# Patient Record
Sex: Female | Born: 2000 | Race: Black or African American | Hispanic: No | Marital: Single | Smoking: Never smoker
Health system: Southern US, Community
[De-identification: ages and names within clinical notes are randomized; demographics above are authoritative.]

## PROBLEM LIST (undated history)

## (undated) DIAGNOSIS — E611 Iron deficiency: Secondary | ICD-10-CM

## (undated) HISTORY — PX: CARDIAC SURGERY: SHX584

## (undated) HISTORY — PX: EYE SURGERY: SHX253

---

## 2018-10-01 ENCOUNTER — Emergency Department (HOSPITAL_COMMUNITY)
Admission: EM | Admit: 2018-10-01 | Discharge: 2018-10-01 | Disposition: A | Discharge status: Home / Self Care | Payer: Federal, State, Local not specified - PPO | Attending: Emergency Medicine | Admitting: Emergency Medicine

## 2018-10-01 ENCOUNTER — Other Ambulatory Visit: Payer: Self-pay

## 2018-10-01 ENCOUNTER — Encounter (HOSPITAL_COMMUNITY): Payer: Self-pay

## 2018-10-01 DIAGNOSIS — R51 Headache: Secondary | ICD-10-CM | POA: Diagnosis present

## 2018-10-01 DIAGNOSIS — G441 Vascular headache, not elsewhere classified: Secondary | ICD-10-CM | POA: Insufficient documentation

## 2018-10-01 DIAGNOSIS — R42 Dizziness and giddiness: Secondary | ICD-10-CM | POA: Diagnosis not present

## 2018-10-01 DIAGNOSIS — F10120 Alcohol abuse with intoxication, uncomplicated: Secondary | ICD-10-CM

## 2018-10-01 HISTORY — DX: Iron deficiency: E61.1

## 2018-10-01 LAB — URINALYSIS, ROUTINE W REFLEX MICROSCOPIC
Bilirubin Urine: NEGATIVE
Glucose, UA: NEGATIVE mg/dL
Ketones, ur: 20 mg/dL — AB
Leukocytes,Ua: NEGATIVE
Nitrite: NEGATIVE
Protein, ur: 100 mg/dL — AB
RBC / HPF: >50 RBC/hpf — ABNORMAL HIGH (ref 0–5)
Specific Gravity, Urine: 1.026 (ref 1.005–1.030)
pH: 5 (ref 5.0–8.0)

## 2018-10-01 LAB — POC URINE PREG, ED: Preg Test, Ur: NEGATIVE

## 2018-10-01 MED ORDER — ONDANSETRON 4 MG PO TBDP
4.0000 mg | ORAL_TABLET | Freq: Once | ORAL | Status: AC
  Administered 2018-10-01: 4 mg via ORAL
  Filled 2018-10-01: qty 1

## 2018-10-01 MED ORDER — SODIUM CHLORIDE 0.9 % IV SOLN
Freq: Once | INTRAVENOUS | Status: AC
  Administered 2018-10-01: 18:00:00 via INTRAVENOUS

## 2018-10-01 MED ORDER — IBUPROFEN 200 MG PO TABS
400.0000 mg | ORAL_TABLET | Freq: Once | ORAL | Status: AC
  Administered 2018-10-01: 400 mg via ORAL
  Filled 2018-10-01: qty 2

## 2018-10-01 MED ORDER — FAMOTIDINE IN NACL 20-0.9 MG/50ML-% IV SOLN
20.0000 mg | Freq: Once | INTRAVENOUS | Status: AC
  Administered 2018-10-01: 20 mg via INTRAVENOUS
  Filled 2018-10-01: qty 50

## 2018-10-01 NOTE — Discharge Instructions (Addendum)
Your are having after affects of to much alcohol last night. Drink plenty of fluids to stay hydrated eat a well balanced diet and take motrin as needed if your headache returns. Return for worsening symptoms.

## 2018-10-01 NOTE — ED Triage Notes (Signed)
Patient c/o headache and dizziness. Patient states she had a long night with alcohol last night.

## 2018-10-01 NOTE — ED Provider Notes (Signed)
[facility]-EMERGENCY DEPT Provider Note   CSN: [id number] Arrival date & time: 10/01/18  1605     History   Chief Complaint Chief Complaint  Patient presents with  . Headache  . Dizziness    HPI Natasha Harrell is a 18 y.o. female who presents to the ED with c/o headache and feeling lightheaded. Patient reports going out drinking last night and had too much alcohol. Patient reports drinks several different kinds of mixed drinks including "PJ".   HPI  Past Medical History:  Diagnosis Date  . Low iron     There are no active problems to display for this patient.   Past Surgical History:  Procedure Laterality Date  . CARDIAC SURGERY    . EYE SURGERY       OB History   No obstetric history on file.      Home Medications    Prior to Admission medications   Not on File    Family History Family History  Problem Relation Age of Onset  . Anemia Mother   . Hypertension Father     Social History Social History   Tobacco Use  . Smoking status: Never Smoker  . Smokeless tobacco: Never Used  Substance Use Topics  . Alcohol use: Yes  . Drug use: Never     Allergies   Patient has no known allergies.   Review of Systems Review of Systems  Constitutional: Negative for chills and fever.  HENT: Negative.   Eyes: Negative for visual disturbance.  Respiratory: Negative for shortness of breath.   Cardiovascular: Negative for chest pain.  Gastrointestinal: Positive for nausea. Abdominal pain: mild.  Genitourinary: Negative for dysuria, frequency and urgency.  Musculoskeletal: Negative for myalgias and neck pain.  Skin: Negative for rash.  Neurological: Positive for light-headedness and headaches.  Psychiatric/Behavioral: Negative for confusion.     Physical Exam Updated Vital Signs BP 129/89 (BP Location: Left Arm)   Pulse 89   Temp 98.3 F (36.8 C) (Oral)   Resp 18   Ht 5\' 1"  (1.549 m)   Wt 62.1 kg   LMP 10/01/2018   SpO2 99%    BMI 25.89 kg/m   Physical Exam Vitals signs and nursing note reviewed.  Constitutional:      General: She is not in acute distress.    Appearance: She is well-developed.  HENT:     Head: Normocephalic.     Right Ear: Tympanic membrane normal.     Left Ear: Tympanic membrane normal.     Nose: Nose normal.     Mouth/Throat:     Pharynx: Oropharynx is clear.  Eyes:     Extraocular Movements: Extraocular movements intact.     Right eye: No nystagmus.     Left eye: No nystagmus.     Pupils: Pupils are equal, round, and reactive to light.  Neck:     Musculoskeletal: Normal range of motion and neck supple. No neck rigidity.  Cardiovascular:     Rate and Rhythm: Normal rate and regular rhythm.  Pulmonary:     Effort: Pulmonary effort is normal.     Breath sounds: Normal breath sounds.  Abdominal:     Palpations: Abdomen is soft.     Tenderness: There is abdominal tenderness (mild epigastric).  Musculoskeletal: Normal range of motion.  Lymphadenopathy:     Cervical: No cervical adenopathy.  Skin:    General: Skin is warm and dry.  Neurological:     Mental Status: She is  alert and oriented to person, place, and time.     Cranial Nerves: No cranial nerve deficit.     Motor: Motor function is intact.     Coordination: Coordination is intact. Romberg sign negative.     Gait: Gait normal.     Comments: Stands on one foot without difficulty  Psychiatric:        Mood and Affect: Mood normal.      ED Treatments / Results  Labs (all labs ordered are listed, but only abnormal results are displayed) Labs Reviewed  URINALYSIS, ROUTINE W REFLEX MICROSCOPIC - Abnormal; Notable for the following components:      Result Value   APPearance CLOUDY (*)    Hgb urine dipstick LARGE (*)    Ketones, ur 20 (*)    Protein, ur 100 (*)    RBC / HPF >50 (*)    Bacteria, UA FEW (*)    All other components within normal limits  POC URINE PREG, ED   Radiology No results  found.  Procedures Procedures (including critical care time)  Medications Ordered in ED Medications  0.9 %  sodium chloride infusion ( Intravenous Stopped 10/01/18 1931)  ondansetron (ZOFRAN-ODT) disintegrating tablet 4 mg (4 mg Oral Given 10/01/18 1738)  ibuprofen (ADVIL,MOTRIN) tablet 400 mg (400 mg Oral Given 10/01/18 1739)  famotidine (PEPCID) IVPB 20 mg premix (0 mg Intravenous Stopped 10/01/18 1931)     Initial Impression / Assessment and Plan / ED Course  I have reviewed the triage vital signs and the nursing notes.  18 y.o. female here with headache and lightheadedness and nausea  S/p drinking alcohol last night stable for d/c with symptoms resolving after IV hydration and medications in the ED. Discussed with the patient alcohol related problems. Discussed that if her symptoms worsen to return to the ED. No neuro deficits on exam after treatment in the ED. Patient appears safe for d/c.  Final Clinical Impressions(s) / ED Diagnoses   Final diagnoses:  Hangover, uncomplicated [facility])  Other vascular headache    ED Discharge Orders    None       Kerrie [place], [place] 10/01/18 2035    Raeford Razor, MD 10/01/18 2050

## 2019-11-23 ENCOUNTER — Ambulatory Visit: Payer: BLUE CROSS/BLUE SHIELD | Attending: Family

## 2019-11-23 DIAGNOSIS — Z23 Encounter for immunization: Secondary | ICD-10-CM

## 2019-11-23 NOTE — Progress Notes (Signed)
   Covid-19 Vaccination Clinic  Name:  Natasha Harrell    MRN: [id number] DOB: [birth year]  11/23/2019  Ms. Natasha Harrell was observed post Covid-19 immunization for 15 minutes without incident. She was provided with Vaccine Information Sheet and instruction to access the V-Safe system.   Ms. Natasha Harrell was instructed to call 911 with any severe reactions post vaccine: Marland Kitchen Difficulty breathing  . Swelling of face and throat  . A fast heartbeat  . A bad rash all over body  . Dizziness and weakness   Immunizations Administered    Name Date Dose VIS Date Route   Moderna COVID-19 Vaccine 11/23/2019 10:49 AM 0.5 mL 07/18/2019 Intramuscular   Manufacturer: Moderna   Lot: [id number]   NDC: 04599-774-14

## 2019-11-25 ENCOUNTER — Emergency Department (HOSPITAL_COMMUNITY): Payer: BLUE CROSS/BLUE SHIELD

## 2019-11-25 ENCOUNTER — Other Ambulatory Visit: Payer: Self-pay

## 2019-11-25 ENCOUNTER — Emergency Department (HOSPITAL_COMMUNITY)
Admission: EM | Admit: 2019-11-25 | Discharge: 2019-11-25 | Disposition: A | Discharge status: Home / Self Care | Payer: BLUE CROSS/BLUE SHIELD | Attending: Emergency Medicine | Admitting: Emergency Medicine

## 2019-11-25 DIAGNOSIS — U071 COVID-19: Secondary | ICD-10-CM | POA: Insufficient documentation

## 2019-11-25 DIAGNOSIS — R21 Rash and other nonspecific skin eruption: Secondary | ICD-10-CM | POA: Diagnosis present

## 2019-11-25 DIAGNOSIS — R42 Dizziness and giddiness: Secondary | ICD-10-CM | POA: Insufficient documentation

## 2019-11-25 LAB — BASIC METABOLIC PANEL
Anion gap: 10 (ref 5–15)
BUN: 8 mg/dL (ref 6–20)
CO2: 21 mmol/L — ABNORMAL LOW (ref 22–32)
Calcium: 9 mg/dL (ref 8.9–10.3)
Chloride: 105 mmol/L (ref 98–111)
Creatinine, Ser: 0.93 mg/dL (ref 0.44–1.00)
GFR calc Af Amer: >60 mL/min (ref >60)
GFR calc non Af Amer: >60 mL/min (ref >60)
Glucose, Bld: 87 mg/dL (ref 70–99)
Potassium: 3.9 mmol/L (ref 3.5–5.1)
Sodium: 136 mmol/L (ref 135–145)

## 2019-11-25 LAB — CBC WITH DIFFERENTIAL/PLATELET
Abs Immature Granulocytes: 0 10*3/uL (ref 0.00–0.07)
Basophils Absolute: 0 10*3/uL (ref 0.0–0.1)
Basophils Relative: 0 %
Eosinophils Absolute: 0 10*3/uL (ref 0.0–0.5)
Eosinophils Relative: 0 %
HCT: 40.6 % (ref 36.0–46.0)
Hemoglobin: 13.1 g/dL (ref 12.0–15.0)
Immature Granulocytes: 0 %
Lymphocytes Relative: 19 %
Lymphs Abs: 0.7 10*3/uL (ref 0.7–4.0)
MCH: 28.4 pg (ref 26.0–34.0)
MCHC: 32.3 g/dL (ref 30.0–36.0)
MCV: 87.9 fL (ref 80.0–100.0)
Monocytes Absolute: 0.7 10*3/uL (ref 0.1–1.0)
Monocytes Relative: 21 %
Neutro Abs: 2 10*3/uL (ref 1.7–7.7)
Neutrophils Relative %: 60 %
Platelets: 299 10*3/uL (ref 150–400)
RBC: 4.62 MIL/uL (ref 3.87–5.11)
RDW: 13.2 % (ref 11.5–15.5)
WBC: 3.4 10*3/uL — ABNORMAL LOW (ref 4.0–10.5)
nRBC: 0 % (ref 0.0–0.2)

## 2019-11-25 LAB — URINALYSIS, ROUTINE W REFLEX MICROSCOPIC
Bilirubin Urine: NEGATIVE
Glucose, UA: NEGATIVE mg/dL
Hgb urine dipstick: NEGATIVE
Ketones, ur: 20 mg/dL — AB
Leukocytes,Ua: NEGATIVE
Nitrite: NEGATIVE
Protein, ur: 30 mg/dL — AB
Specific Gravity, Urine: 1.025 (ref 1.005–1.030)
pH: 5 (ref 5.0–8.0)

## 2019-11-25 LAB — POC SARS CORONAVIRUS 2 AG -  ED: SARS Coronavirus 2 Ag: POSITIVE — AB

## 2019-11-25 LAB — POC URINE PREG, ED: Preg Test, Ur: NEGATIVE

## 2019-11-25 MED ORDER — ACETAMINOPHEN 325 MG PO TABS
325.0000 mg | ORAL_TABLET | Freq: Once | ORAL | Status: AC
  Administered 2019-11-25: 325 mg via ORAL
  Filled 2019-11-25: qty 1

## 2019-11-25 MED ORDER — FAMOTIDINE 20 MG PO TABS
20.0000 mg | ORAL_TABLET | Freq: Once | ORAL | Status: AC
  Administered 2019-11-25: 18:00:00 20 mg via ORAL
  Filled 2019-11-25: qty 1

## 2019-11-25 MED ORDER — SODIUM CHLORIDE 0.9 % IV BOLUS (SEPSIS)
1000.0000 mL | Freq: Once | INTRAVENOUS | Status: AC
  Administered 2019-11-25: 1000 mL via INTRAVENOUS

## 2019-11-25 MED ORDER — PREDNISONE 20 MG PO TABS
60.0000 mg | ORAL_TABLET | Freq: Once | ORAL | Status: AC
  Administered 2019-11-25: 60 mg via ORAL
  Filled 2019-11-25: qty 3

## 2019-11-25 MED ORDER — DIPHENHYDRAMINE HCL 25 MG PO CAPS
25.0000 mg | ORAL_CAPSULE | Freq: Once | ORAL | Status: AC
  Administered 2019-11-25: 18:00:00 25 mg via ORAL
  Filled 2019-11-25: qty 1

## 2019-11-25 MED ORDER — PREDNISONE 10 MG PO TABS: 40.0000 mg | ORAL_TABLET | Freq: Every day | ORAL | 0 refills | Status: AC

## 2019-11-25 NOTE — ED Provider Notes (Signed)
[facility] EMERGENCY DEPARTMENT Provider Note   CSN: [id number] Arrival date & time: 11/25/19  1456     History Chief Complaint  Patient presents with  . Dizziness  . Rash    Natasha Harrell is a 19 y.o. female.  Patient is a 19 year old female with no past medical history presenting to the emergency department for rash and feeling unwell.  Patient reports that yesterday she got the first dose of the [facility] Covid-19 vaccination.  She reports last night she got up in the middle the night to use the bathroom.  Reports that she went to the bathroom just fine but when she got up to walk back to her room she felt kind of weak and dizzy.  Reports that then this morning she woke up with a rash on the right side of her face.  She does report that she has had sick contacts within the last week of someone who was coughing.  Reports that she has also been having a slight cough.  She denies any fever, chills, nausea, vomiting, dysuria, abdominal pain, diarrhea, shortness of breath.  Denies new soaps, lotions, detergents.  Her rash does not itch or blister.        Past Medical History:  Diagnosis Date  . Low iron     There are no problems to display for this patient.   Past Surgical History:  Procedure Laterality Date  . CARDIAC SURGERY    . EYE SURGERY       OB History   No obstetric history on file.     Family History  Problem Relation Age of Onset  . Anemia Mother   . Hypertension Father     Social History   Tobacco Use  . Smoking status: Never Smoker  . Smokeless tobacco: Never Used  Substance Use Topics  . Alcohol use: Yes  . Drug use: Never    Home Medications Prior to Admission medications   Medication Sig Start Date End Date Taking? Authorizing Provider  predniSONE (DELTASONE) 10 MG tablet Take 4 tablets (40 mg total) by mouth daily for 5 days. 11/25/19 11/30/19  Arlyn Dunning, PA-C    Allergies    Patient has no known allergies.  Review  of Systems   Review of Systems  Constitutional: Positive for fatigue. Negative for activity change, appetite change, chills, diaphoresis and fever.  HENT: Negative for congestion, ear pain and sore throat.   Eyes: Negative for photophobia and visual disturbance.  Respiratory: Positive for cough. Negative for shortness of breath.   Cardiovascular: Negative for chest pain.  Gastrointestinal: Negative for abdominal pain, diarrhea, nausea and vomiting.  Genitourinary: Negative for dysuria, pelvic pain and urgency.  Musculoskeletal: Negative for arthralgias.  Skin: Negative for rash and wound.  Allergic/Immunologic: Negative for immunocompromised state.  Neurological: Positive for dizziness, weakness (generalized) and light-headedness. Negative for syncope, speech difficulty and headaches.    Physical Exam Updated Vital Signs BP 120/75 (BP Location: Right Arm)   Pulse 88   Temp (!) 100.6 F (38.1 C) (Oral)   Resp 18   Ht 5\' 1"  (1.549 m)   Wt 66.7 kg   LMP 11/11/2019   SpO2 99%   BMI 27.78 kg/m   Physical Exam Vitals and nursing note reviewed.  Constitutional:      General: She is not in acute distress.    Appearance: Normal appearance. She is not ill-appearing, toxic-appearing or diaphoretic.  HENT:     Head: Normocephalic.  Mouth/Throat:     Mouth: Mucous membranes are moist.  Eyes:     Conjunctiva/sclera: Conjunctivae normal.  Cardiovascular:     Rate and Rhythm: Regular rhythm. Tachycardia present.  Pulmonary:     Effort: Pulmonary effort is normal.     Breath sounds: Normal breath sounds.  Abdominal:     General: Abdomen is flat.     Tenderness: There is no abdominal tenderness.  Skin:    General: Skin is warm and dry.     Comments: Right side of face with hive like, blanching rash over entire right side of face.  Neurological:     General: No focal deficit present.     Mental Status: She is alert.  Psychiatric:        Mood and Affect: Mood normal.     ED  Results / Procedures / Treatments   Labs (all labs ordered are listed, but only abnormal results are displayed) Labs Reviewed  CBC WITH DIFFERENTIAL/PLATELET - Abnormal; Notable for the following components:      Result Value   WBC 3.4 (*)    All other components within normal limits  BASIC METABOLIC PANEL - Abnormal; Notable for the following components:   CO2 21 (*)    All other components within normal limits  URINALYSIS, ROUTINE W REFLEX MICROSCOPIC - Abnormal; Notable for the following components:   APPearance HAZY (*)    Ketones, ur 20 (*)    Protein, ur 30 (*)    Bacteria, UA RARE (*)    All other components within normal limits  POC SARS CORONAVIRUS 2 AG -  ED - Abnormal; Notable for the following components:   SARS Coronavirus 2 Ag POSITIVE (*)    All other components within normal limits  POC URINE PREG, ED    EKG EKG Interpretation  Date/Time:  Saturday November 25 2019 15:01:22 EDT Ventricular Rate:  127 PR Interval:  114 QRS Duration: 62 QT Interval:  284 QTC Calculation: 412 R Axis:   56 Text Interpretation: Sinus tachycardia Right atrial enlargement Borderline ECG No old tracing to compare Confirmed by Lacretia Leigh ([id number]) on 11/25/2019 3:43:42 PM   Radiology DG Chest Portable 1 View  Result Date: 11/25/2019 CLINICAL DATA:  Fever and cough. EXAM: PORTABLE CHEST 1 VIEW COMPARISON:  None. FINDINGS: The heart size and mediastinal contours are within normal limits. Both lungs are clear. The visualized skeletal structures are unremarkable. IMPRESSION: No active disease. Electronically Signed   By: Dorise Bullion III M.D   On: 11/25/2019 16:17    Procedures Procedures (including critical care time)  Medications Ordered in ED Medications  sodium chloride 0.9 % bolus 1,000 mL (0 mLs Intravenous Stopped 11/25/19 1752)  acetaminophen (TYLENOL) tablet 325 mg (325 mg Oral Given 11/25/19 1648)  diphenhydrAMINE (BENADRYL) capsule 25 mg (25 mg Oral Given 11/25/19 1751)    famotidine (PEPCID) tablet 20 mg (20 mg Oral Given 11/25/19 1751)  predniSONE (DELTASONE) tablet 60 mg (60 mg Oral Given 11/25/19 1751)    ED Course  I have reviewed the triage vital signs and the nursing notes.  Pertinent labs & imaging results that were available during my care of the patient were reviewed by me and considered in my medical decision making (see chart for details).  Clinical Course as of Nov 25 2042  Sat Nov 25, 2019  1557 Overall well appearing female presenting to ER for right sided rash on face and fever since last night. Received moderna vaccine #1 this past Thursday. +  sick contacts. She appears very well and normal on exam but did have mild tachycardia and low grade fever of 100.6.  Rash appears hive like without blisters/clusters and not in dermatomal fashion.    [KM]  1557 Workup reveals + rapid covid test. Otherwise reassuring chest xray, EKG, BMP. She is not hypoxic. Tachycardia resolved with IV fluids. She does not meet criteria for monoclonal antibody infusion. I will treat her hives with benadryl, pepcid and steroids. No signs of anaphylaxis. Discussed findings with patient and her mother over the phone and plan agreed upon.    [KM]    Clinical Course User Index [KM] Natasha Harrell   MDM Rules/Calculators/A&P                      Based on review of vitals, medical screening exam, lab work and/or imaging, there does not appear to be an acute, emergent etiology for the patient's symptoms. Counseled pt on good return precautions and encouraged both PCP and ED follow-up as needed.  Prior to discharge, I also discussed incidental imaging findings with patient in detail and advised appropriate, recommended follow-up in detail.  Clinical Impression: 1. COVID-19   2. Rash     Disposition: Discharge  Prior to providing a prescription for a controlled substance, I independently reviewed the patient's recent prescription history on the [place]  Controlled Substance Reporting System. The patient had no recent or regular prescriptions and was deemed appropriate for a brief, less than 3 day prescription of narcotic for acute analgesia.  This note was prepared with assistance of Conservation officer, historic buildings. Occasional wrong-word or sound-a-like substitutions may have occurred due to the inherent limitations of voice recognition software.  Final Clinical Impression(s) / ED Diagnoses Final diagnoses:  COVID-19  Rash    Rx / DC Orders ED Discharge Orders         Ordered    predniSONE (DELTASONE) 10 MG tablet  Daily     11/25/19 1816           Natasha Harrell 11/25/19 2044    Lorre Nick, MD 11/28/19 [phone]

## 2019-11-25 NOTE — ED Triage Notes (Signed)
Pt. Stated. I got the COVID vaccination April 8, Thursday and this morning around 0430 I got dizzy and developed a rash on my face.

## 2019-11-25 NOTE — ED Notes (Signed)
Pt. Stated,  I feel fine right now.

## 2019-11-25 NOTE — Discharge Instructions (Signed)
Thank you for allowing me to care for you today. Please return to the emergency department if you have new or worsening symptoms. Take your medications as instructed.  ° °

## 2019-12-19 ENCOUNTER — Ambulatory Visit: Payer: BLUE CROSS/BLUE SHIELD | Attending: Family

## 2019-12-19 DIAGNOSIS — Z23 Encounter for immunization: Secondary | ICD-10-CM

## 2019-12-19 NOTE — Progress Notes (Signed)
   Covid-19 Vaccination Clinic  Name:  Natasha Harrell    MRN: [id number] DOB: [birth year]  12/19/2019  [name] was observed post Covid-19 immunization for 15 minutes without incident. She was provided with Vaccine Information Sheet and instruction to access the V-Safe system.   [name] was instructed to call 911 with any severe reactions post vaccine: Marland Kitchen Difficulty breathing  . Swelling of face and throat  . A fast heartbeat  . A bad rash all over body  . Dizziness and weakness   Immunizations Administered    Name Date Dose VIS Date Route   Moderna COVID-19 Vaccine 12/19/2019  1:57 PM 0.5 mL 07/2019 Intramuscular   Manufacturer: Moderna   Lot: [id number]   NDC: 03794-446-19

## 2020-04-18 ENCOUNTER — Ambulatory Visit (HOSPITAL_COMMUNITY)
Admission: EM | Admit: 2020-04-18 | Discharge: 2020-04-18 | Discharge status: Against Medical Advice | Payer: BLUE CROSS/BLUE SHIELD

## 2020-04-18 ENCOUNTER — Other Ambulatory Visit: Payer: Self-pay

## 2021-11-01 IMAGING — DX DG CHEST 1V PORT
1 series · 1 of 1 positions shown · non-contrast
Comparison: None.

CLINICAL DATA: Fever and cough.

EXAM:
PORTABLE CHEST 1 VIEW

[chest ap]
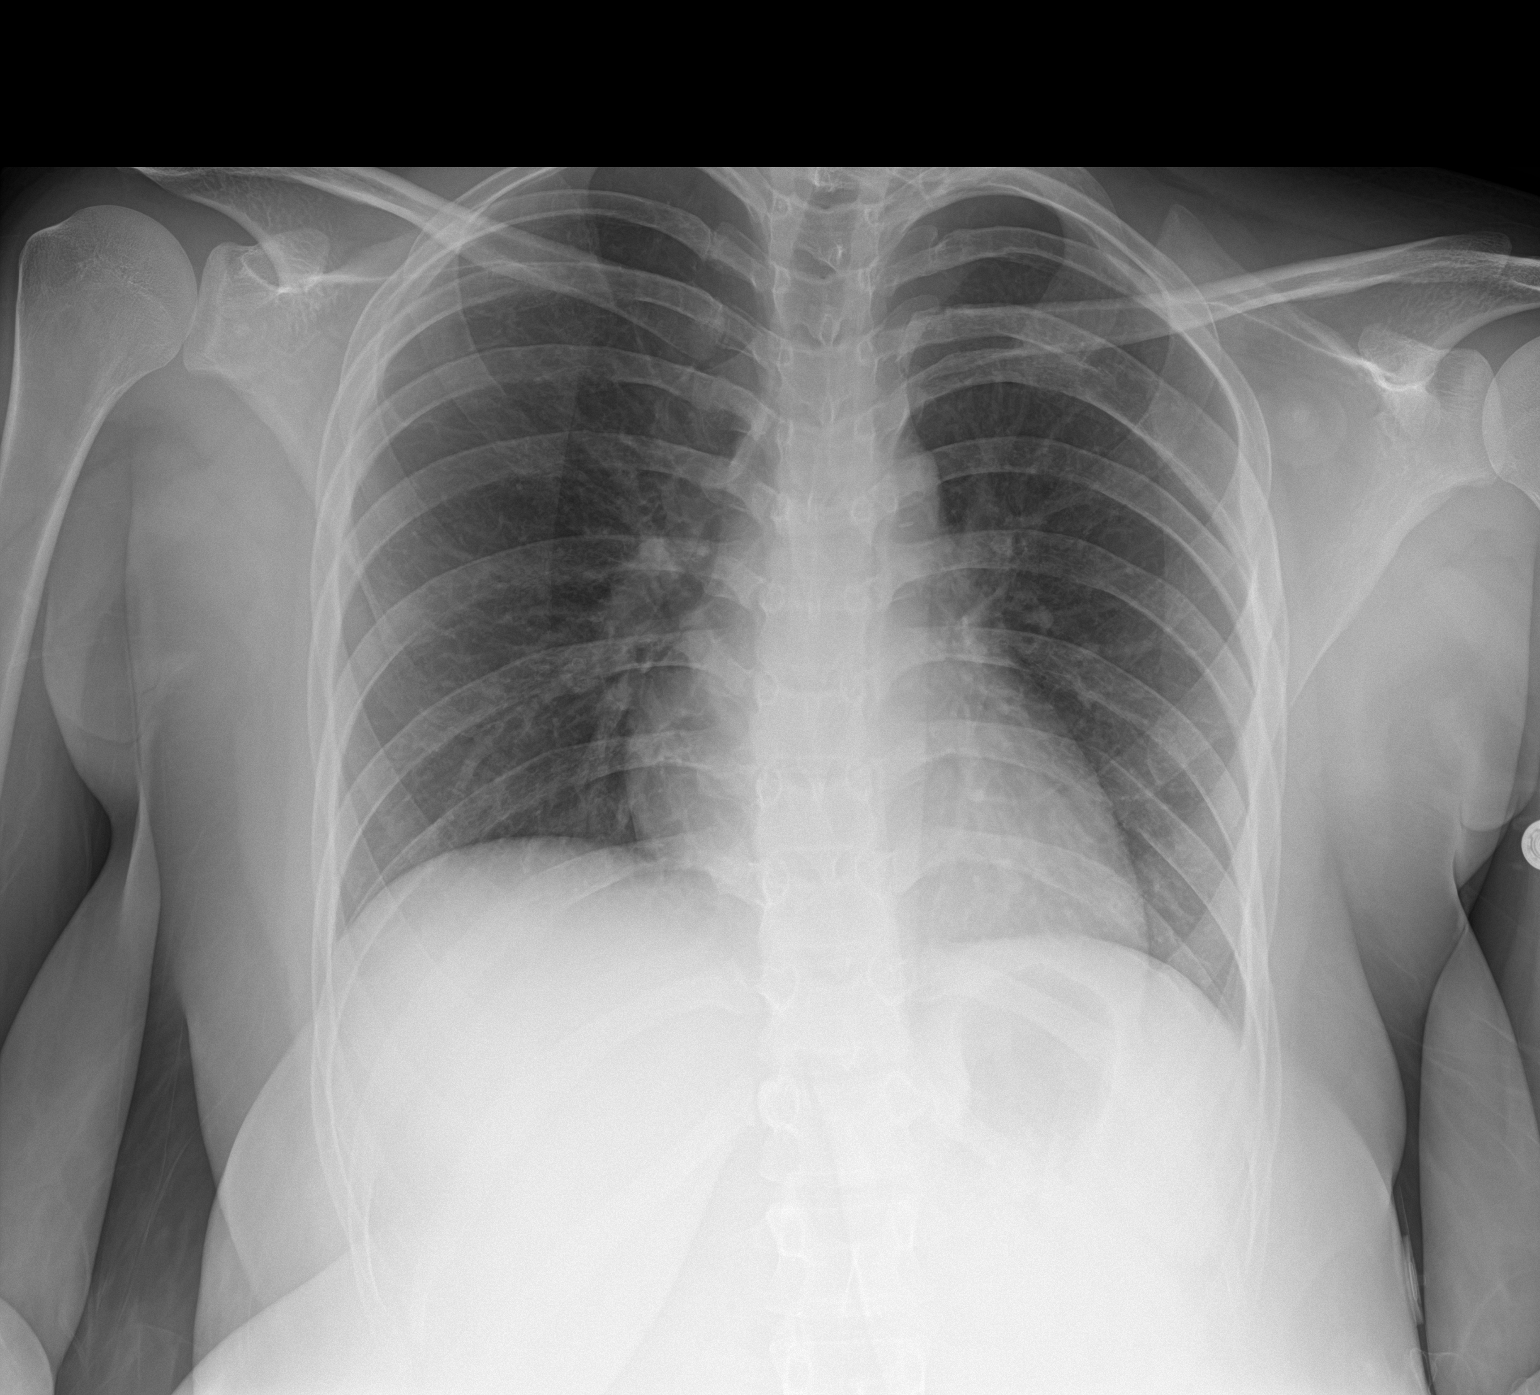

[1 of 1 positions shown; findings below may reference images not displayed]

FINDINGS: The heart size and mediastinal contours are within normal limits.
Both lungs are clear. The visualized skeletal structures are
unremarkable.
IMPRESSION: No active disease.
